# Patient Record
Sex: Male | Born: 1955 | Race: White | Hispanic: No | Marital: Married | Smoking: Former smoker
Health system: Southern US, Community
[De-identification: ages and names within clinical notes are randomized; demographics above are authoritative.]

## PROBLEM LIST (undated history)

## (undated) DIAGNOSIS — I10 Essential (primary) hypertension: Secondary | ICD-10-CM

## (undated) DIAGNOSIS — E7800 Pure hypercholesterolemia, unspecified: Secondary | ICD-10-CM

## (undated) HISTORY — PX: COLONOSCOPY: SHX174

## (undated) HISTORY — PX: HERNIA REPAIR: SHX51

---

## 2003-12-12 ENCOUNTER — Ambulatory Visit (HOSPITAL_COMMUNITY): Admission: RE | Admit: 2003-12-12 | Discharge: 2003-12-12 | Payer: Self-pay | Admitting: General Surgery

## 2005-04-13 ENCOUNTER — Ambulatory Visit (HOSPITAL_COMMUNITY): Admission: RE | Admit: 2005-04-13 | Discharge: 2005-04-13 | Payer: Self-pay | Admitting: Family Medicine

## 2006-05-25 ENCOUNTER — Ambulatory Visit (HOSPITAL_COMMUNITY): Admission: RE | Admit: 2006-05-25 | Discharge: 2006-05-25 | Payer: Self-pay | Admitting: Family Medicine

## 2006-06-28 ENCOUNTER — Ambulatory Visit (HOSPITAL_COMMUNITY): Admission: RE | Admit: 2006-06-28 | Discharge: 2006-06-28 | Payer: Self-pay | Admitting: General Surgery

## 2007-06-07 ENCOUNTER — Ambulatory Visit (HOSPITAL_COMMUNITY): Admission: RE | Admit: 2007-06-07 | Discharge: 2007-06-07 | Payer: Self-pay | Admitting: Family Medicine

## 2007-09-30 ENCOUNTER — Ambulatory Visit (HOSPITAL_COMMUNITY): Admission: RE | Admit: 2007-09-30 | Discharge: 2007-09-30 | Payer: Self-pay | Admitting: Family Medicine

## 2008-06-15 ENCOUNTER — Ambulatory Visit (HOSPITAL_COMMUNITY): Admission: RE | Admit: 2008-06-15 | Discharge: 2008-06-15 | Payer: Self-pay | Admitting: Family Medicine

## 2010-10-03 NOTE — H&P (Signed)
NAME:  RYKIN, ROUTE              ACCOUNT NO.:  [id number]   MEDICAL RECORD NO.:  [id number]          PATIENT TYPE:  AMB   LOCATION:  DAY                           FACILITY:  [facility]   PHYSICIAN:  Dalia Heading, M.D.  DATE OF BIRTH:  07/23/1955   DATE OF ADMISSION:  DATE OF DISCHARGE:  LH                              HISTORY & PHYSICAL   CHIEF COMPLAINT:  Need for screening colonoscopy.   HISTORY OF PRESENT ILLNESS:  The patient is a 55 year old white male who  is referred for endoscopic evaluation.  He needs a colonoscopy for  screening purposes.  No abdominal pain, weight loss, nausea, vomiting,  diarrhea, constipation, melena, hematochezia have been noted.  He has  never had a colonoscopy.  There is no family history of colon carcinoma.   PAST MEDICAL HISTORY:  Includes hypertension.   PAST SURGICAL HISTORY:  Bilateral inguinal herniorrhaphies in the past.   CURRENT MEDICATIONS:  Vytorin.   ALLERGIES:  No known drug allergies.   REVIEW OF SYSTEMS:  Noncontributory.   PHYSICAL EXAMINATION:  GENERAL:  The patient is a well-developed, well-  nourished white male in no acute distress.  LUNGS:  Clear to auscultation with equal breath sounds bilaterally.  HEART: Examination reveals regular rate and rhythm without S3, S4, or  murmurs.  ABDOMEN: Soft, nontender, nondistended.  No hepatosplenomegaly or masses  noted.  RECTAL:  Examination was deferred to the procedure.   IMPRESSION:  Need for screening colonoscopy.   PLAN:  The patient is scheduled for colonoscopy on June 28, 2006.  The risks and benefits of the procedure including bleeding and  perforation were fully explained to the patient, gave informed consent.      Dalia Heading, M.D.  Electronically Signed     MAJ/MEDQ  D:  06/15/2006  T:  06/15/2006  Job:  [id number]   cc:   [facility]  Fax: [phone]   Kirk Ruths, M.D.  Fax: [phone]

## 2010-10-03 NOTE — Op Note (Signed)
NAME:  Robert Pollard, Robert Pollard                        ACCOUNT NO.:  [id number]   MEDICAL RECORD NO.:  [id number]                   PATIENT TYPE:  AMB   LOCATION:  DAY                                  FACILITY:  [facility]   PHYSICIAN:  Dalia Heading, M.D.               DATE OF BIRTH:  25-Aug-1955   DATE OF PROCEDURE:  12/12/2003  DATE OF DISCHARGE:                                 OPERATIVE REPORT   PREOPERATIVE DIAGNOSIS:  Right inguinal hernia.   POSTOPERATIVE DIAGNOSIS:  Right inguinal hernia.   PROCEDURE:  Right inguinal herniorrhaphy.   SURGEON:  Dalia Heading, M.D.   ANESTHESIA:  General.   INDICATIONS:  The patient is a 55 year old white male who presents with a  symptomatic right inguinal hernia.  The risks and benefits of the procedure  including bleeding, infection, pain, and the possibility of recurrence of  the hernia were fully explained to the patient who gave informed consent.   DESCRIPTION OF PROCEDURE:  The patient was placed in the supine position  after general anesthesia was administered.  The right groin region was  prepped and draped using the usual sterile technique with Betadine.  Surgical site confirmation was performed.   A transverse incision was made in the right groin region down to the  external oblique aponeurosis.  The aponeuroses was incised to the external  ring.  A Penrose drain was placed around the spermatic cord.  The inguinal  nerve was identified and retracted inferiorly from the operative field.  An  indirect hernia sac was found.  This was separated from the spermatic cord  up to the peroneal reflection and inverted.  Medium size Marlex mesh plug  was then placed in this region.  An onlay polypropylene Marlex mesh patch  was then placed along the floor of the inguinal canal and secured superiorly  to the conjoined tendon and inferiorly to the shelving edge of Poupart's  ligament using a 2-0 Novofil interrupted suture.  The internal ring was  recreated using a 2-0 Novofil interrupted suture.  The external oblique  aponeurosis was reapproximated using a 2-0 Vicryl running suture.  The  subcutaneous layer was reapproximated using a 3-0 Vicryl interrupted suture.  The skin was closed using a 4-0 Vicryl subcuticular suture.  Sensorcaine  0.5% was instilled in the surrounding wound, and the wound was covered with  collodion.   All tape and needle counts were correct at the end of the procedure.  The  patient was extubated in the operating room and went back to the recovery  room awake and in stable condition.   COMPLICATIONS:  None.   SPECIMENS:  None.   ESTIMATED BLOOD LOSS:  Minimal.      ___________________________________________  Dalia Heading, M.D.   MAJ/MEDQ  D:  12/12/2003  T:  12/12/2003  Job:  [id number]   cc:   Kirk Ruths, M.D.  [address]  [place]  [place] [address]  Fax: [phone]

## 2014-06-29 ENCOUNTER — Ambulatory Visit (HOSPITAL_COMMUNITY)
Admission: RE | Admit: 2014-06-29 | Discharge: 2014-06-29 | Disposition: A | Discharge status: Home / Self Care | Payer: BLUE CROSS/BLUE SHIELD | Source: Ambulatory Visit | Attending: Family Medicine | Admitting: Family Medicine

## 2014-06-29 ENCOUNTER — Other Ambulatory Visit (HOSPITAL_COMMUNITY): Payer: Self-pay | Admitting: Family Medicine

## 2014-06-29 DIAGNOSIS — R059 Cough, unspecified: Secondary | ICD-10-CM

## 2014-06-29 DIAGNOSIS — Z0000 Encounter for general adult medical examination without abnormal findings: Secondary | ICD-10-CM

## 2014-06-29 DIAGNOSIS — R05 Cough: Secondary | ICD-10-CM

## 2014-06-29 DIAGNOSIS — J189 Pneumonia, unspecified organism: Secondary | ICD-10-CM | POA: Insufficient documentation

## 2015-10-26 IMAGING — CR DG CHEST 2V
2 series · 2 of 2 positions shown · non-contrast
Comparison: None.

CLINICAL DATA: Cough.

EXAM:
CHEST  2 VIEW

[view not recorded (1 of 2)]
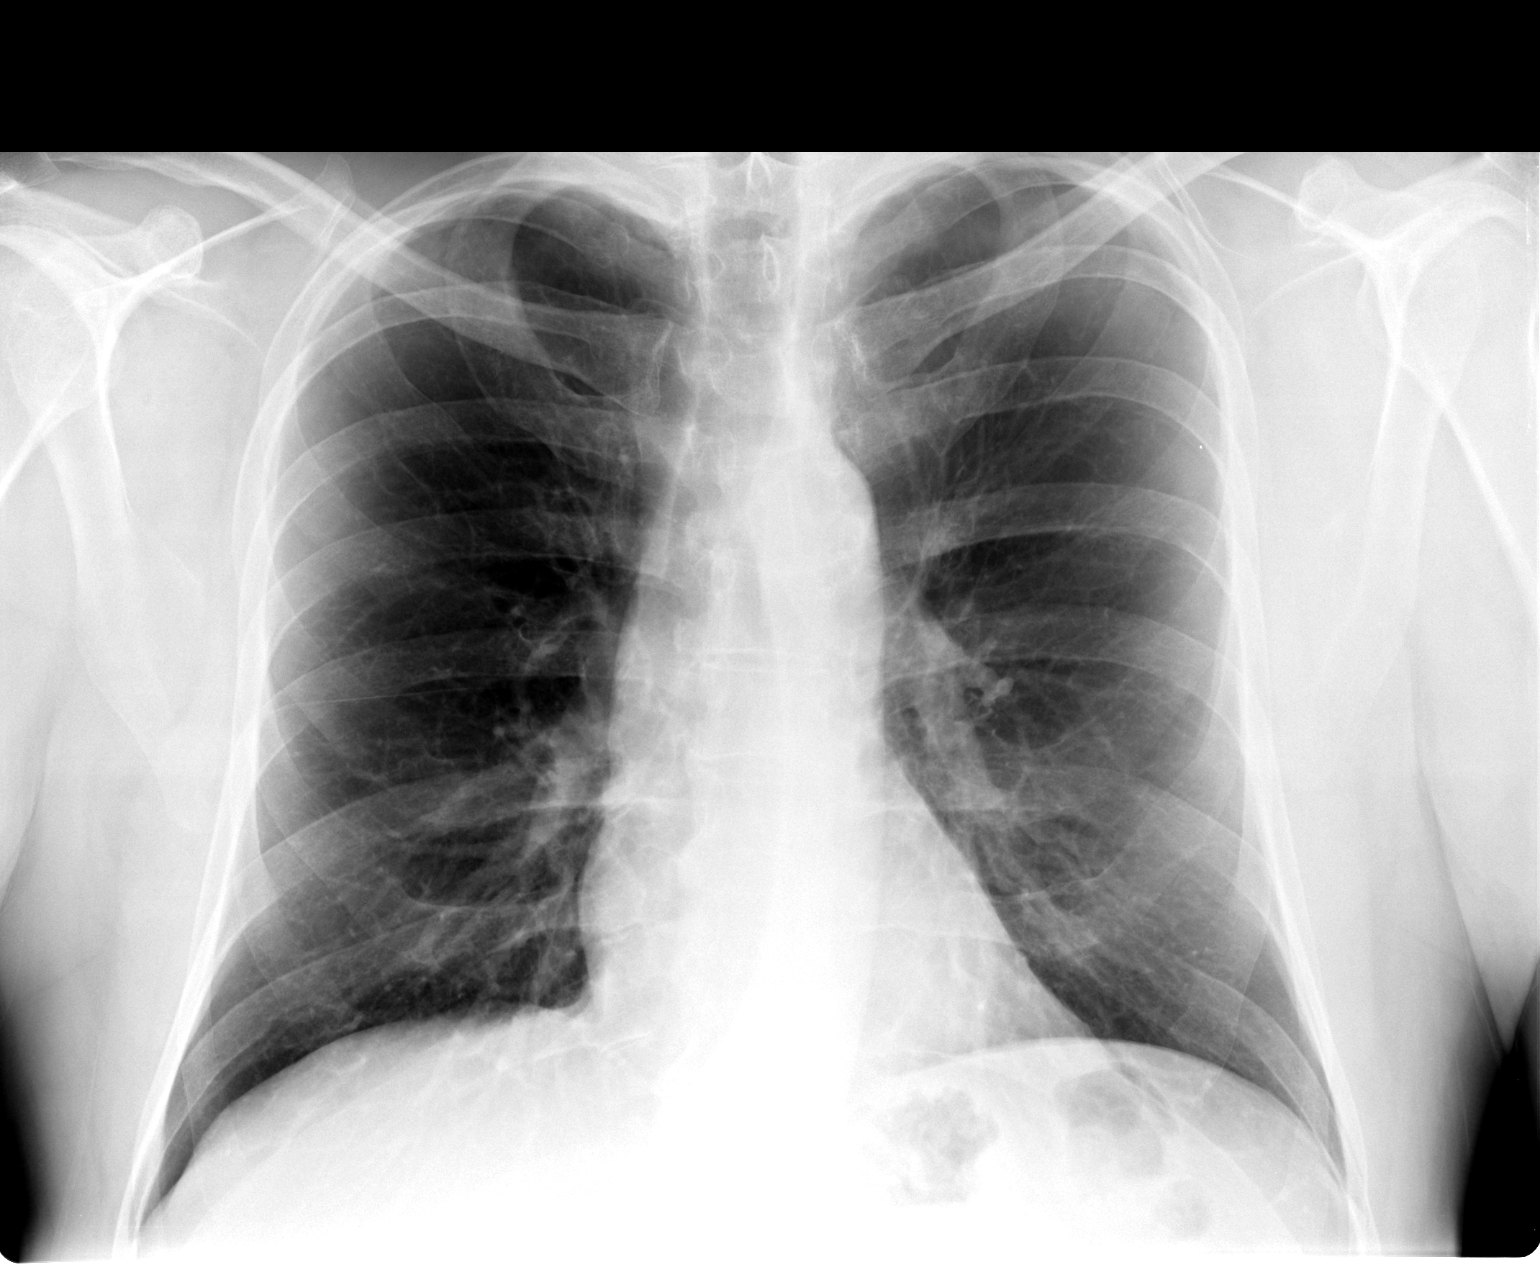

[view not recorded (2 of 2)]
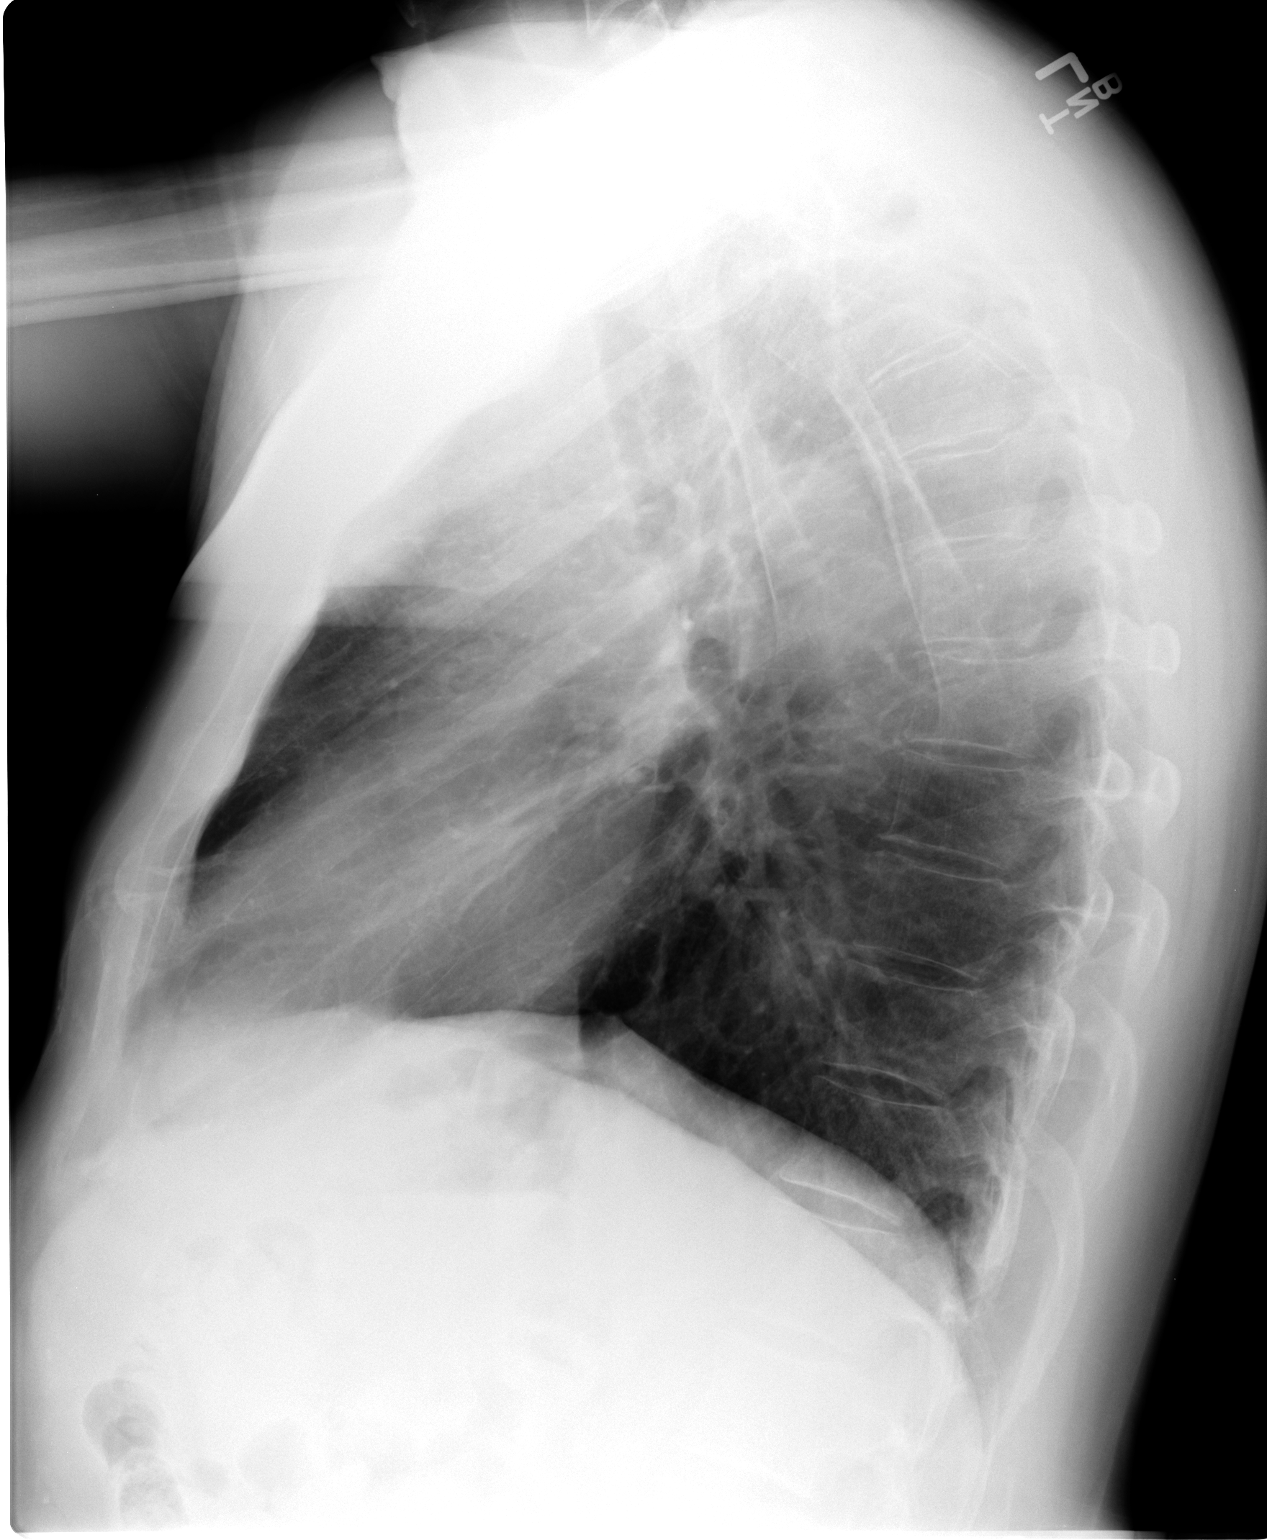

[2 of 2 positions shown; findings below may reference images not displayed]

FINDINGS: Mediastinum and hilar structures are normal. Heart size normal. Mild
lingular infiltrate noted. Findings suggesting mild pneumonia. No
pleural effusion or pneumothorax. No acute bony abnormality .
IMPRESSION: Mild lingular infiltrate noted consistent with pneumonia.

## 2019-09-06 ENCOUNTER — Encounter (INDEPENDENT_AMBULATORY_CARE_PROVIDER_SITE_OTHER): Payer: Self-pay | Admitting: *Deleted

## 2019-11-28 ENCOUNTER — Other Ambulatory Visit (INDEPENDENT_AMBULATORY_CARE_PROVIDER_SITE_OTHER): Payer: Self-pay | Admitting: *Deleted

## 2019-11-28 DIAGNOSIS — Z1211 Encounter for screening for malignant neoplasm of colon: Secondary | ICD-10-CM

## 2019-12-19 ENCOUNTER — Telehealth (INDEPENDENT_AMBULATORY_CARE_PROVIDER_SITE_OTHER): Payer: Self-pay | Admitting: *Deleted

## 2019-12-19 ENCOUNTER — Encounter (INDEPENDENT_AMBULATORY_CARE_PROVIDER_SITE_OTHER): Payer: Self-pay | Admitting: *Deleted

## 2019-12-19 MED ORDER — SUTAB 1479-225-188 MG PO TABS: 1.0000 | ORAL_TABLET | Freq: Once | ORAL | 0 refills | Status: AC

## 2019-12-19 NOTE — Telephone Encounter (Signed)
Patient needs Sutab (copay card) ° °

## 2019-12-19 NOTE — Telephone Encounter (Signed)
Referring MD/PCP: vyas   Procedure: tcs  Reason/Indication:  screening  Has patient had this procedure before?  Yes, over 10 yrs ago   If so, when, by whom and where?    Is there a family history of colon cancer?  no  Who?  What age when diagnosed?    Is patient diabetic?   no      Does patient have prosthetic heart valve or mechanical valve?  no  Do you have a pacemaker/defibrillator?  no  Has patient ever had endocarditis/atrial fibrillation? no  Does patient use oxygen? no  Has patient had joint replacement within last 12 months?  no  Is patient constipated or do they take laxatives? no  Does patient have a history of alcohol/drug use?  no  Is patient on blood thinner such as Coumadin, Plavix and/or Aspirin? yes  Medications: asa 81 mg daily, atorvastatin 40 mg daily, omeprazole 20 mg daily, vlasartan 80 mg daily  Allergies: nkda  Medication Adjustment per Dr Rehman/Dr Jenetta Downer asa 2 days  Procedure date & time: 01/18/20 at 830

## 2020-01-16 ENCOUNTER — Other Ambulatory Visit: Payer: Self-pay

## 2020-01-16 ENCOUNTER — Other Ambulatory Visit (HOSPITAL_COMMUNITY)
Admission: RE | Admit: 2020-01-16 | Discharge: 2020-01-16 | Disposition: A | Discharge status: Home / Self Care | Payer: BC Managed Care – PPO | Source: Ambulatory Visit | Attending: Internal Medicine | Admitting: Internal Medicine

## 2020-01-16 DIAGNOSIS — Z20822 Contact with and (suspected) exposure to covid-19: Secondary | ICD-10-CM | POA: Diagnosis not present

## 2020-01-16 DIAGNOSIS — Z01812 Encounter for preprocedural laboratory examination: Secondary | ICD-10-CM | POA: Diagnosis not present

## 2020-01-16 LAB — SARS CORONAVIRUS 2 (TAT 6-24 HRS): SARS Coronavirus 2: NEGATIVE

## 2020-01-18 ENCOUNTER — Other Ambulatory Visit: Payer: Self-pay

## 2020-01-18 ENCOUNTER — Encounter (HOSPITAL_COMMUNITY)
Admission: RE | Disposition: A | Discharge status: Home / Self Care | Payer: Self-pay | Source: Home / Self Care | Attending: Internal Medicine

## 2020-01-18 ENCOUNTER — Encounter (HOSPITAL_COMMUNITY): Payer: Self-pay | Admitting: Internal Medicine

## 2020-01-18 ENCOUNTER — Ambulatory Visit (HOSPITAL_COMMUNITY)
Admission: RE | Admit: 2020-01-18 | Discharge: 2020-01-18 | Disposition: A | Discharge status: Home / Self Care | Payer: BC Managed Care – PPO | Attending: Internal Medicine | Admitting: Internal Medicine

## 2020-01-18 DIAGNOSIS — E7800 Pure hypercholesterolemia, unspecified: Secondary | ICD-10-CM | POA: Insufficient documentation

## 2020-01-18 DIAGNOSIS — I10 Essential (primary) hypertension: Secondary | ICD-10-CM | POA: Diagnosis not present

## 2020-01-18 DIAGNOSIS — Z79899 Other long term (current) drug therapy: Secondary | ICD-10-CM | POA: Diagnosis not present

## 2020-01-18 DIAGNOSIS — Z7982 Long term (current) use of aspirin: Secondary | ICD-10-CM | POA: Diagnosis not present

## 2020-01-18 DIAGNOSIS — D122 Benign neoplasm of ascending colon: Secondary | ICD-10-CM | POA: Insufficient documentation

## 2020-01-18 DIAGNOSIS — K5730 Diverticulosis of large intestine without perforation or abscess without bleeding: Secondary | ICD-10-CM | POA: Insufficient documentation

## 2020-01-18 DIAGNOSIS — Z1211 Encounter for screening for malignant neoplasm of colon: Secondary | ICD-10-CM | POA: Insufficient documentation

## 2020-01-18 DIAGNOSIS — D123 Benign neoplasm of transverse colon: Secondary | ICD-10-CM | POA: Diagnosis not present

## 2020-01-18 DIAGNOSIS — Z87891 Personal history of nicotine dependence: Secondary | ICD-10-CM | POA: Insufficient documentation

## 2020-01-18 HISTORY — PX: COLONOSCOPY: SHX5424

## 2020-01-18 HISTORY — DX: Pure hypercholesterolemia, unspecified: E78.00

## 2020-01-18 HISTORY — DX: Essential (primary) hypertension: I10

## 2020-01-18 HISTORY — PX: POLYPECTOMY: SHX5525

## 2020-01-18 LAB — HM COLONOSCOPY

## 2020-01-18 SURGERY — COLONOSCOPY
Anesthesia: Moderate Sedation

## 2020-01-18 MED ORDER — STERILE WATER FOR IRRIGATION IR SOLN
Status: DC | PRN
  Administered 2020-01-18: 1.5 mL

## 2020-01-18 MED ORDER — SODIUM CHLORIDE 0.9 % IV SOLN: INTRAVENOUS | Status: DC

## 2020-01-18 MED ORDER — MEPERIDINE HCL 50 MG/ML IJ SOLN
INTRAMUSCULAR | Status: AC
  Filled 2020-01-18: qty 1

## 2020-01-18 MED ORDER — MEPERIDINE HCL 50 MG/ML IJ SOLN
INTRAMUSCULAR | Status: DC | PRN
Start: 2020-01-18 — End: 2020-01-18
  Administered 2020-01-18 (×2): 25 mg

## 2020-01-18 MED ORDER — MIDAZOLAM HCL 5 MG/5ML IJ SOLN
INTRAMUSCULAR | Status: AC
  Filled 2020-01-18: qty 10

## 2020-01-18 MED ORDER — MIDAZOLAM HCL 5 MG/5ML IJ SOLN
INTRAMUSCULAR | Status: DC | PRN
  Administered 2020-01-18 (×3): 2 mg via INTRAVENOUS

## 2020-01-18 NOTE — Discharge Instructions (Signed)
No aspirin or NSAIDs for 1 week. Resume other medications as before. High-fiber diet. No driving for 24 hours. Physician will call with biopsy results. Remember you cannot have an MRI until clip has passed.      Colonoscopy, Adult, Care After This sheet gives you information about how to care for yourself after your procedure. Your doctor may also give you more specific instructions. If you have problems or questions, call your doctor. What can I expect after the procedure? After the procedure, it is common to have:  A small amount of blood in your poop (stool) for 24 hours.  Some gas.  Mild cramping or bloating in your belly (abdomen). Follow these instructions at home: Eating and drinking   Drink enough fluid to keep your pee (urine) pale yellow.  Follow instructions from your doctor about what you cannot eat or drink.  Return to your normal diet as told by your doctor. Avoid heavy or fried foods that are hard to digest. Activity  Rest as told by your doctor.  Do not sit for a long time without moving. Get up to take short walks every 1-2 hours. This is important. Ask for help if you feel weak or unsteady.  Return to your normal activities as told by your doctor. Ask your doctor what activities are safe for you. To help cramping and bloating:   Try walking around.  Put heat on your belly as told by your doctor. Use the heat source that your doctor recommends, such as a moist heat pack or a heating pad. ? Put a towel between your skin and the heat source. ? Leave the heat on for 20-30 minutes. ? Remove the heat if your skin turns bright red. This is very important if you are unable to feel pain, heat, or cold. You may have a greater risk of getting burned. General instructions  For the first 24 hours after the procedure: ? Do not drive or use machinery. ? Do not sign important documents. ? Do not drink alcohol. ? Do your daily activities more slowly than  normal. ? Eat foods that are soft and easy to digest.  Take over-the-counter or prescription medicines only as told by your doctor.  Keep all follow-up visits as told by your doctor. This is important. Contact a doctor if:  You have blood in your poop 2-3 days after the procedure. Get help right away if:  You have more than a small amount of blood in your poop.  You see large clumps of tissue (blood clots) in your poop.  Your belly is swollen.  You feel like you may vomit (nauseous).  You vomit.  You have a fever.  You have belly pain that gets worse, and medicine does not help your pain. Summary  After the procedure, it is common to have a small amount of blood in your poop. You may also have mild cramping and bloating in your belly.  For the first 24 hours after the procedure, do not drive or use machinery, do not sign important documents, and do not drink alcohol.  Get help right away if you have a lot of blood in your poop, feel like you may vomit, have a fever, or have more belly pain. This information is not intended to replace advice given to you by your health care provider. Make sure you discuss any questions you have with your health care provider. Document Revised: 11/28/2018 Document Reviewed: 11/28/2018 Elsevier Patient Education  [place].  Colon Polyps  Polyps are tissue growths inside the body. Polyps can grow in many places, including the large intestine (colon). A polyp may be a round bump or a mushroom-shaped growth. You could have one polyp or several. Most colon polyps are noncancerous (benign). However, some colon polyps can become cancerous over time. Finding and removing the polyps early can help prevent this. What are the causes? The exact cause of colon polyps is not known. What increases the risk? You are more likely to develop this condition if you:  Have a family history of colon cancer or colon polyps.  Are older than 21 or  older than 45 if you are African American.  Have inflammatory bowel disease, such as ulcerative colitis or Crohn's disease.  Have certain hereditary conditions, such as: ? Familial adenomatous polyposis. ? Lynch syndrome. ? Turcot syndrome. ? Peutz-Jeghers syndrome.  Are overweight.  Smoke cigarettes.  Do not get enough exercise.  Drink too much alcohol.  Eat a diet that is high in fat and red meat and low in fiber.  Had childhood cancer that was treated with abdominal radiation. What are the signs or symptoms? Most polyps do not cause symptoms. If you have symptoms, they may include:  Blood coming from your rectum when having a bowel movement.  Blood in your stool. The stool may look dark red or black.  Abdominal pain.  A change in bowel habits, such as constipation or diarrhea. How is this diagnosed? This condition is diagnosed with a colonoscopy. This is a procedure in which a lighted, flexible scope is inserted into the anus and then passed into the colon to examine the area. Polyps are sometimes found when a colonoscopy is done as part of routine cancer screening tests. How is this treated? Treatment for this condition involves removing any polyps that are found. Most polyps can be removed during a colonoscopy. Those polyps will then be tested for cancer. Additional treatment may be needed depending on the results of testing. Follow these instructions at home: Lifestyle  Maintain a healthy weight, or lose weight if recommended by your health care provider.  Exercise every day or as told by your health care provider.  Do not use any products that contain nicotine or tobacco, such as cigarettes and e-cigarettes. If you need help quitting, ask your health care provider.  If you drink alcohol, limit how much you have: ? 0-1 drink a day for women. ? 0-2 drinks a day for men.  Be aware of how much alcohol is in your drink. In the [place] one drink equals one 12 oz bottle  of beer (355 mL), one 5 oz glass of wine (148 mL), or one 1 oz shot of hard liquor (44 mL). Eating and drinking   Eat foods that are high in fiber, such as fruits, vegetables, and whole grains.  Eat foods that are high in calcium and vitamin D, such as milk, cheese, yogurt, eggs, liver, fish, and broccoli.  Limit foods that are high in fat, such as fried foods and desserts.  Limit the amount of red meat and processed meat you eat, such as hot dogs, sausage, bacon, and lunch meats. General instructions  Keep all follow-up visits as told by your health care provider. This is important. ? This includes having regularly scheduled colonoscopies. ? Talk to your health care provider about when you need a colonoscopy. Contact a health care provider if:  You have new or worsening bleeding during a bowel movement.  You have new or increased blood in your stool.  You have a change in bowel habits.  You lose weight for no known reason. Summary  Polyps are tissue growths inside the body. Polyps can grow in many places, including the colon.  Most colon polyps are noncancerous (benign), but some can become cancerous over time.  This condition is diagnosed with a colonoscopy.  Treatment for this condition involves removing any polyps that are found. Most polyps can be removed during a colonoscopy. This information is not intended to replace advice given to you by your health care provider. Make sure you discuss any questions you have with your health care provider. Document Revised: 08/19/2017 Document Reviewed: 08/19/2017 Elsevier Patient Education  [place].    High-Fiber Diet Fiber, also called dietary fiber, is a type of carbohydrate that is found in fruits, vegetables, whole grains, and beans. A high-fiber diet can have many health benefits. Your health care provider may recommend a high-fiber diet to help:  Prevent constipation. Fiber can make your bowel movements more  regular.  Lower your cholesterol.  Relieve the following conditions: ? Swelling of veins in the anus (hemorrhoids). ? Swelling and irritation (inflammation) of specific areas of the digestive tract (uncomplicated diverticulosis). ? A problem of the large intestine (colon) that sometimes causes pain and diarrhea (irritable bowel syndrome, IBS).  Prevent overeating as part of a weight-loss plan.  Prevent heart disease, type 2 diabetes, and certain cancers. What is my plan? The recommended daily fiber intake in grams (g) includes:  38 g for men age 9 or younger.  30 g for men over age 10.  46 g for women age 53 or younger.  21 g for women over age 40. You can get the recommended daily intake of dietary fiber by:  Eating a variety of fruits, vegetables, grains, and beans.  Taking a fiber supplement, if it is not possible to get enough fiber through your diet. What do I need to know about a high-fiber diet?  It is better to get fiber through food sources rather than from fiber supplements. There is not a lot of research about how effective supplements are.  Always check the fiber content on the nutrition facts label of any prepackaged food. Look for foods that contain 5 g of fiber or more per serving.  Talk with a diet and nutrition specialist (dietitian) if you have questions about specific foods that are recommended or not recommended for your medical condition, especially if those foods are not listed below.  Gradually increase how much fiber you consume. If you increase your intake of dietary fiber too quickly, you may have bloating, cramping, or gas.  Drink plenty of water. Water helps you to digest fiber. What are tips for following this plan?  Eat a wide variety of high-fiber foods.  Make sure that half of the grains that you eat each day are whole grains.  Eat breads and cereals that are made with whole-grain flour instead of refined flour or white flour.  Eat brown  rice, bulgur wheat, or millet instead of white rice.  Start the day with a breakfast that is high in fiber, such as a cereal that contains 5 g of fiber or more per serving.  Use beans in place of meat in soups, salads, and pasta dishes.  Eat high-fiber snacks, such as berries, raw vegetables, nuts, and popcorn.  Choose whole fruits and vegetables instead of processed forms like juice or sauce. What foods  can I eat?  Fruits Berries. Pears. Apples. Oranges. Avocado. Prunes and raisins. Dried figs. Vegetables Sweet potatoes. Spinach. Kale. Artichokes. Cabbage. Broccoli. Cauliflower. Green peas. Carrots. Squash. Grains Whole-grain breads. Multigrain cereal. Oats and oatmeal. Brown rice. Barley. Bulgur wheat. [place]. Quinoa. Bran muffins. Popcorn. Rye wafer crackers. Meats and other proteins [place], kidney, and pinto beans. Soybeans. Split peas. Lentils. Nuts and seeds. Dairy Fiber-fortified yogurt. Beverages Fiber-fortified soy milk. Fiber-fortified orange juice. Other foods Fiber bars. The items listed above may not be a complete list of recommended foods and beverages. Contact a dietitian for more options. What foods are not recommended? Fruits Fruit juice. Cooked, strained fruit. Vegetables Fried potatoes. Canned vegetables. Well-cooked vegetables. Grains White bread. Pasta made with refined flour. White rice. Meats and other proteins Fatty cuts of meat. Fried chicken or fried fish. Dairy Milk. Yogurt. Cream cheese. Sour cream. Fats and oils Butters. Beverages Soft drinks. Other foods Cakes and pastries. The items listed above may not be a complete list of foods and beverages to avoid. Contact a dietitian for more information. Summary  Fiber is a type of carbohydrate. It is found in fruits, vegetables, whole grains, and beans.  There are many health benefits of eating a high-fiber diet, such as preventing constipation, lowering blood cholesterol, helping with weight loss,  and reducing your risk of heart disease, diabetes, and certain cancers.  Gradually increase your intake of fiber. Increasing too fast can result in cramping, bloating, and gas. Drink plenty of water while you increase your fiber.  The best sources of fiber include whole fruits and vegetables, whole grains, nuts, seeds, and beans. This information is not intended to replace advice given to you by your health care provider. Make sure you discuss any questions you have with your health care provider. Document Revised: 03/08/2017 Document Reviewed: 03/08/2017 Elsevier Patient Education  [place].    Diverticulosis  Diverticulosis is a condition that develops when small pouches (diverticula) form in the wall of the large intestine (colon). The colon is where water is absorbed and stool (feces) is formed. The pouches form when the inside layer of the colon pushes through weak spots in the outer layers of the colon. You may have a few pouches or many of them. The pouches usually do not cause problems unless they become inflamed or infected. When this happens, the condition is called diverticulitis. What are the causes? The cause of this condition is not known. What increases the risk? The following factors may make you more likely to develop this condition:  Being older than age 18. Your risk for this condition increases with age. Diverticulosis is rare among people younger than age 52. By age 20, many people have it.  Eating a low-fiber diet.  Having frequent constipation.  Being overweight.  Not getting enough exercise.  Smoking.  Taking over-the-counter pain medicines, like aspirin and ibuprofen.  Having a family history of diverticulosis. What are the signs or symptoms? In most people, there are no symptoms of this condition. If you do have symptoms, they may include:  Bloating.  Cramps in the abdomen.  Constipation or diarrhea.  Pain in the lower left side of the  abdomen. How is this diagnosed? Because diverticulosis usually has no symptoms, it is most often diagnosed during an exam for other colon problems. The condition may be diagnosed by:  Using a flexible scope to examine the colon (colonoscopy).  Taking an X-ray of the colon after dye has been put into the colon (barium  enema).  Having a CT scan. How is this treated? You may not need treatment for this condition. Your health care provider may recommend treatment to prevent problems. You may need treatment if you have symptoms or if you previously had diverticulitis. Treatment may include:  Eating a high-fiber diet.  Taking a fiber supplement.  Taking a live bacteria supplement (probiotic).  Taking medicine to relax your colon. Follow these instructions at home: Medicines  Take over-the-counter and prescription medicines only as told by your health care provider.  If told by your health care provider, take a fiber supplement or probiotic. Constipation prevention Your condition may cause constipation. To prevent or treat constipation, you may need to:  Drink enough fluid to keep your urine pale yellow.  Take over-the-counter or prescription medicines.  Eat foods that are high in fiber, such as beans, whole grains, and fresh fruits and vegetables.  Limit foods that are high in fat and processed sugars, such as fried or sweet foods.  General instructions  Try not to strain when you have a bowel movement.  Keep all follow-up visits as told by your health care provider. This is important. Contact a health care provider if you:  Have pain in your abdomen.  Have bloating.  Have cramps.  Have not had a bowel movement in 3 days. Get help right away if:  Your pain gets worse.  Your bloating becomes very bad.  You have a fever or chills, and your symptoms suddenly get worse.  You vomit.  You have bowel movements that are bloody or black.  You have bleeding from your  rectum. Summary  Diverticulosis is a condition that develops when small pouches (diverticula) form in the wall of the large intestine (colon).  You may have a few pouches or many of them.  This condition is most often diagnosed during an exam for other colon problems.  Treatment may include increasing the fiber in your diet, taking supplements, or taking medicines. This information is not intended to replace advice given to you by your health care provider. Make sure you discuss any questions you have with your health care provider. Document Revised: 12/01/2018 Document Reviewed: 12/01/2018 Elsevier Patient Education  [place].

## 2020-01-18 NOTE — Op Note (Signed)
[facility] Patient Name: Robert Pollard Procedure Date: 01/18/2020 8:09 AM MRN: [id number] Date of Birth: 1955/09/02 Attending MD: Hildred Laser , MD CSN: [id number] Age: 64 Admit Type: Outpatient Procedure:                Colonoscopy Indications:              Screening for colorectal malignant neoplasm Providers:                Hildred Laser, MD, Janeece Riggers, RN, Lambert Mody, Casimer Bilis, Technician, Aram Candela Referring MD:             Glenda Chroman, MD Medicines:                Meperidine 50 mg IV, Midazolam 6 mg IV Complications:            No immediate complications. Estimated Blood Loss:     Estimated blood loss was minimal. Procedure:                Pre-Anesthesia Assessment:                           - Prior to the procedure, a History and Physical                            was performed, and patient medications and                            allergies were reviewed. The patient's tolerance of                            previous anesthesia was also reviewed. The risks                            and benefits of the procedure and the sedation                            options and risks were discussed with the patient.                            All questions were answered, and informed consent                            was obtained. Prior Anticoagulants: The patient has                            taken no previous anticoagulant or antiplatelet                            agents except for aspirin. ASA Grade Assessment: II                            -  A patient with mild systemic disease. After                            reviewing the risks and benefits, the patient was                            deemed in satisfactory condition to undergo the                            procedure.                           After obtaining informed consent, the colonoscope                            was passed under  direct vision. Throughout the                            procedure, the patient's blood pressure, pulse, and                            oxygen saturations were monitored continuously. The                            PCF-H190DL ([id number] scope was introduced through                            the anus and advanced to the the cecum, identified                            by appendiceal orifice and ileocecal valve. The                            colonoscopy was performed without difficulty. The                            patient tolerated the procedure well. The quality                            of the bowel preparation was excellent except the                            cecum was fair. The ileocecal valve, appendiceal                            orifice, and rectum were photographed. Scope In: 8:41:17 AM Scope Out: 9:01:23 AM Scope Withdrawal Time: 0 hours 14 minutes 15 seconds  Total Procedure Duration: 0 hours 20 minutes 6 seconds  Findings:      The perianal and digital rectal examinations were normal.      A 8 mm polyp was found in the ascending colon. The polyp was sessile.       The polyp was removed with a cold snare. Resection and retrieval were       complete. To close a defect after  polypectomy, one hemostatic clip was       successfully placed (MR conditional). There was no bleeding at the end       of the procedure. The pathology specimen was placed into Bottle Number 2.      A 5 mm polyp was found in the proximal transverse colon. The polyp was       removed with a cold snare. Resection and retrieval were complete. The       pathology specimen was placed into Bottle Number 1.      Scattered small and large-mouthed diverticula were found in the sigmoid       colon.      The retroflexed view of the distal rectum and anal verge was normal and       showed no anal or rectal abnormalities. Impression:               - One 8 mm polyp in the ascending colon, removed                             with a cold snare. Resected and retrieved. Clip (MR                            conditional) was placed.                           - One 5 mm polyp in the proximal transverse colon,                            removed with a cold snare. Resected and retrieved.                           - Diverticulosis in the sigmoid colon. Moderate Sedation:      Moderate (conscious) sedation was administered by the endoscopy nurse       and supervised by the endoscopist. The following parameters were       monitored: oxygen saturation, heart rate, blood pressure, CO2       capnography and response to care. Total physician intraservice time was       25 minutes. Recommendation:           - Patient has a contact number available for                            emergencies. The signs and symptoms of potential                            delayed complications were discussed with the                            patient. Return to normal activities tomorrow.                            Written discharge instructions were provided to the                            patient.                           -  High fiber diet today.                           - Continue present medications.                           - No aspirin, ibuprofen, naproxen, or other                            non-steroidal anti-inflammatory drugs for 7 days.                           - Await pathology results.                           - Repeat colonoscopy is recommended. The                            colonoscopy date will be determined after pathology                            results from today's exam become available for                            review. Procedure Code(s):        --- Professional ---                           [phone], Colonoscopy, flexible; with removal of                            tumor(s), polyp(s), or other lesion(s) by snare                            technique                           99153, Moderate sedation;  each additional 15                            minutes intraservice time                           G0500, Moderate sedation services provided by the                            same physician or other qualified health care                            professional performing a gastrointestinal                            endoscopic service that sedation supports,                            requiring the presence of an independent trained  observer to assist in the monitoring of the                            patient's level of consciousness and physiological                            status; initial 15 minutes of intra-service time;                            patient age 71 years or older (additional time may                            be reported with [phone], as appropriate) Diagnosis Code(s):        --- Professional ---                           Z12.11, Encounter for screening for malignant                            neoplasm of colon                           K63.5, Polyp of colon                           K57.30, Diverticulosis of large intestine without                            perforation or abscess without bleeding CPT copyright 2019 [employer]. All rights reserved. The codes documented in this report are preliminary and upon coder review may  be revised to meet current compliance requirements. Hildred Laser, MD Hildred Laser, MD 01/18/2020 9:13:44 AM This report has been signed electronically. Number of Addenda: 0

## 2020-01-18 NOTE — H&P (Signed)
Robert Pollard is an 64 y.o. male.   Chief Complaint: Patient is here for colonoscopy. HPI: Patient is 64 year old Caucasian male was here for screening colonoscopy.  Last exam was normal 10 years ago.  He denies abdominal pain change in bowel habits or rectal bleeding. Patient is on low-dose aspirin. Family history is negative for CRC.  Past Medical History:  Diagnosis Date  . Hypercholesteremia   . Hypertension       Family History  Problem Relation Age of Onset  . Colon cancer Neg Hx    Social History:  reports that he has quit smoking. He has never used smokeless tobacco. He reports current alcohol use of about 2.0 standard drinks of alcohol per week. He reports previous drug use.  Allergies: No Known Allergies  Medications Prior to Admission  Medication Sig Dispense Refill  . aspirin EC 81 MG tablet Take 81 mg by mouth 2 (two) times a week. Mondays & Fridays. Swallow whole.    Marland Kitchen atorvastatin (LIPITOR) 40 MG tablet Take 40 mg by mouth at bedtime.     Marland Kitchen omeprazole (PRILOSEC) 20 MG capsule Take 20 mg by mouth at bedtime.     . Tadalafil 2.5 MG TABS Take 2.5 mg by mouth once a week.     . valsartan (DIOVAN) 80 MG tablet Take 80 mg by mouth daily.      No results found for this or any previous visit (from the past 48 hour(s)). No results found.  Review of Systems  Blood pressure (!) 195/89, pulse (!) 50, temperature 97.9 F (36.6 C), temperature source Oral, resp. rate 14, height 6\' 3"  (1.905 m), weight 82.1 kg, SpO2 100 %. Physical Exam HENT:     Mouth/Throat:     Mouth: Mucous membranes are moist.     Pharynx: Oropharynx is clear.  Eyes:     General: No scleral icterus.    Conjunctiva/sclera: Conjunctivae normal.  Cardiovascular:     Rate and Rhythm: Normal rate and regular rhythm.     Heart sounds: Normal heart sounds. No murmur heard.   Pulmonary:     Effort: Pulmonary effort is normal.     Breath sounds: Normal breath sounds.  Abdominal:     General: There  is no distension.     Palpations: Abdomen is soft. There is no mass.     Tenderness: There is no abdominal tenderness.  Musculoskeletal:        General: No swelling.     Cervical back: Neck supple.  Lymphadenopathy:     Cervical: No cervical adenopathy.  Skin:    General: Skin is warm and dry.  Neurological:     Mental Status: He is alert.      Assessment/Plan Average risk screening colonoscopy.  Hildred Laser, MD 01/18/2020, 8:33 AM

## 2020-01-19 LAB — SURGICAL PATHOLOGY

## 2020-01-24 ENCOUNTER — Encounter (HOSPITAL_COMMUNITY): Payer: Self-pay | Admitting: Internal Medicine

## 2020-02-09 ENCOUNTER — Encounter (INDEPENDENT_AMBULATORY_CARE_PROVIDER_SITE_OTHER): Payer: Self-pay | Admitting: *Deleted

## 2021-08-07 ENCOUNTER — Ambulatory Visit: Payer: Medicare Other | Admitting: Dermatology

## 2021-08-07 ENCOUNTER — Encounter: Payer: Self-pay | Admitting: Dermatology

## 2021-08-07 ENCOUNTER — Other Ambulatory Visit: Payer: Self-pay

## 2021-08-07 DIAGNOSIS — L918 Other hypertrophic disorders of the skin: Secondary | ICD-10-CM | POA: Diagnosis not present

## 2021-08-07 DIAGNOSIS — D1801 Hemangioma of skin and subcutaneous tissue: Secondary | ICD-10-CM | POA: Diagnosis not present

## 2021-08-07 DIAGNOSIS — Z1283 Encounter for screening for malignant neoplasm of skin: Secondary | ICD-10-CM

## 2021-08-07 DIAGNOSIS — L821 Other seborrheic keratosis: Secondary | ICD-10-CM

## 2021-08-07 DIAGNOSIS — D2372 Other benign neoplasm of skin of left lower limb, including hip: Secondary | ICD-10-CM

## 2021-08-07 DIAGNOSIS — D239 Other benign neoplasm of skin, unspecified: Secondary | ICD-10-CM

## 2021-08-07 DIAGNOSIS — B351 Tinea unguium: Secondary | ICD-10-CM

## 2021-08-19 ENCOUNTER — Encounter: Payer: Self-pay | Admitting: Dermatology

## 2021-08-19 NOTE — Progress Notes (Signed)
? ?  New Patient ?  ?Subjective  ?Robert Pollard is a 66 y.o. male who presents for the following: Annual Exam (No new concerns). ? ?General skin examination ?Location:  ?Duration:  ?Quality:  ?Associated Signs/Symptoms: ?Modifying Factors:  ?Severity:  ?Timing: ?Context:  ? ? ?The following portions of the chart were reviewed this encounter and updated as appropriate:  Tobacco  Allergies  Meds  Problems  Med Hx  Surg Hx  Fam Hx   ?  ? ?Objective  ?Well appearing patient in no apparent distress; mood and affect are within normal limits. ?Full body check: No atypical pigmented lesions or nonmelanoma skin cancer ? ?Mid Back, Right Thigh - Posterior ?Brown flattopped textured 5 mm papules, no dermoscopic atypia ? ?Left Flank, Mid Back ?Multiple 1 mm smooth red dermal papules ? ?Neck - Anterior ?2 mm pedunculated flesh-colored papule ? ?Left Dorsum of Foot ?Firm 5 mm pink dermal nodule compatible dermoscopy ? ?Right 2nd Proximal Nail fold of Toe ?Superficial white opacification ? ? ? ?A full examination was performed including scalp, head, eyes, ears, nose, lips, neck, chest, axillae, abdomen, back, buttocks, bilateral upper extremities, bilateral lower extremities, hands, feet, fingers, toes, fingernails, and toenails. All findings within normal limits unless otherwise noted below. ? ? ?Assessment & Plan  ?Encounter for screening for malignant neoplasm of skin ? ?Annual skin examination.  Encouraged to self examine with spouse twice annually.  Continue ultraviolet protection. ? ?Seborrheic keratosis (2) ?Right Thigh - Posterior; Mid Back ? ?Leave if stable ? ?Cherry angioma (2) ?Left Flank; Mid Back ? ?No intervention necessary ? ?Skin tag ?Neck - Anterior ? ?May choose removal in future ? ?Dermatofibroma ?Left Dorsum of Foot ? ?Recheck as needed change ? ?Superficial white onychomycosis ?Right 2nd Proximal Nail fold of Toe ? ?No Treatment initiated ? ? ?

## 2021-12-01 ENCOUNTER — Ambulatory Visit: Payer: BC Managed Care – PPO | Admitting: Dermatology

## 2022-08-10 ENCOUNTER — Ambulatory Visit: Payer: Medicare Other | Admitting: Dermatology
